# Patient Record
Sex: Male | Born: 1995 | Race: Black or African American | Hispanic: No | Marital: Single | Smoking: Current every day smoker
Health system: Southern US, Community
[De-identification: ages and names within clinical notes are randomized; demographics above are authoritative.]

## PROBLEM LIST (undated history)

## (undated) DIAGNOSIS — J302 Other seasonal allergic rhinitis: Secondary | ICD-10-CM

## (undated) DIAGNOSIS — F909 Attention-deficit hyperactivity disorder, unspecified type: Secondary | ICD-10-CM

## (undated) HISTORY — DX: Attention-deficit hyperactivity disorder, unspecified type: F90.9

---
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
## 2020-04-27 ENCOUNTER — Ambulatory Visit
Admission: EM | Admit: 2020-04-27 | Discharge: 2020-04-27 | Disposition: A | Discharge status: Home / Self Care | Payer: Self-pay | Attending: Emergency Medicine | Admitting: Emergency Medicine

## 2020-04-27 ENCOUNTER — Other Ambulatory Visit: Payer: Self-pay

## 2020-04-27 DIAGNOSIS — Z202 Contact with and (suspected) exposure to infections with a predominantly sexual mode of transmission: Secondary | ICD-10-CM | POA: Insufficient documentation

## 2020-04-27 DIAGNOSIS — Z113 Encounter for screening for infections with a predominantly sexual mode of transmission: Secondary | ICD-10-CM | POA: Insufficient documentation

## 2020-04-27 HISTORY — DX: Other seasonal allergic rhinitis: J30.2

## 2020-04-27 MED ORDER — DOXYCYCLINE HYCLATE 100 MG PO CAPS: 100.0000 mg | ORAL_CAPSULE | Freq: Two times a day (BID) | ORAL | 0 refills | Status: AC

## 2020-04-27 NOTE — Discharge Instructions (Signed)
Please pick up doxycycline from the pharmacy and take twice daily for 1 week to treat chlamydia.  Please refrain from sexual activity for 7 days while medicine is clearing infection.  We are testing you for Gonorrhea, Chlamydia and Trichomonas. We will call you if anything is positive and let you know if you require any further treatment. Please inform partner of any positive results.  Please return if symptoms not improving with treatment, development of fever, nausea, vomiting, abdominal pain, scrotal pain.

## 2020-04-27 NOTE — ED Provider Notes (Signed)
[facility]    CSN: [id number] Arrival date & time: 04/27/20  1249      History   Chief Complaint No chief complaint on file. STD exposure  HPI Edward Howard is a 25 y.o. male presenting today for STD screening.  Reports recent exposure to chlamydia.  Denies any current symptoms.  Had recent intercourse with this person 1 week ago.  HPI  Past Medical History:  Diagnosis Date  . ADHD (attention deficit hyperactivity disorder)   . Seasonal allergies     There are no problems to display for this patient.   History reviewed. No pertinent surgical history.     Home Medications    Prior to Admission medications   Medication Sig Start Date End Date Taking? Authorizing Provider  doxycycline (VIBRAMYCIN) 100 MG capsule Take 1 capsule (100 mg total) by mouth 2 (two) times daily for 7 days. 04/27/20 05/04/20 Yes Joshau Code, Junius Creamer, PA-C    Family History Family History  Problem Relation Age of Onset  . Healthy Mother   . Healthy Father     Social History Social History   Tobacco Use  . Smoking status: Current Every Day Smoker    Packs/day: 0.50    Types: Cigarettes  . Smokeless tobacco: Never Used  Substance Use Topics  . Alcohol use: No  . Drug use: No     Allergies   Patient has no known allergies.   Review of Systems Review of Systems  Constitutional: Negative for fever.  HENT: Negative for sore throat.   Respiratory: Negative for shortness of breath.   Cardiovascular: Negative for chest pain.  Gastrointestinal: Negative for abdominal pain, nausea and vomiting.  Genitourinary: Negative for difficulty urinating, dysuria, frequency, penile discharge, penile pain, penile swelling, scrotal swelling and testicular pain.  Skin: Negative for rash.  Neurological: Negative for dizziness, light-headedness and headaches.     Physical Exam Triage Vital Signs ED Triage Vitals  Enc Vitals Group     BP 04/27/20 1308 116/69     Pulse Rate 04/27/20  1308 67     Resp 04/27/20 1308 20     Temp 04/27/20 1308 98.4 F (36.9 C)     Temp src --      SpO2 04/27/20 1308 99 %     Weight --      Height --      Head Circumference --      Peak Flow --      Pain Score 04/27/20 1306 0     Pain Loc --      Pain Edu? --      Excl. in GC? --    No data found.  Updated Vital Signs BP 116/69   Pulse 67   Temp 98.4 F (36.9 C)   Resp 20   SpO2 99%   Visual Acuity Right Eye Distance:   Left Eye Distance:   Bilateral Distance:    Right Eye Near:   Left Eye Near:    Bilateral Near:     Physical Exam Vitals and nursing note reviewed.  Constitutional:      Appearance: He is well-developed.     Comments: No acute distress  HENT:     Head: Normocephalic and atraumatic.     Nose: Nose normal.  Eyes:     Conjunctiva/sclera: Conjunctivae normal.  Cardiovascular:     Rate and Rhythm: Normal rate.  Pulmonary:     Effort: Pulmonary effort is normal. No respiratory distress.  Abdominal:  General: There is no distension.  Musculoskeletal:        General: Normal range of motion.     Cervical back: Neck supple.  Skin:    General: Skin is warm and dry.  Neurological:     Mental Status: He is alert and oriented to person, place, and time.      UC Treatments / Results  Labs (all labs ordered are listed, but only abnormal results are displayed) Labs Reviewed  CYTOLOGY, (ORAL, ANAL, URETHRAL) ANCILLARY ONLY    EKG   Radiology No results found.  Procedures Procedures (including critical care time)  Medications Ordered in UC Medications - No data to display  Initial Impression / Assessment and Plan / UC Course  I have reviewed the triage vital signs and the nursing notes.  Pertinent labs & imaging results that were available during my care of the patient were reviewed by me and considered in my medical decision making (see chart for details).     STD screening/exposure to chlamydia-treating with doxycycline x1 week  to cover chlamydia, urethral swab pending to further screen for STDs.  Discussed strict return precautions. Patient verbalized understanding and is agreeable with plan.  Final Clinical Impressions(s) / UC Diagnoses   Final diagnoses:  Screen for STD (sexually transmitted disease)  Exposure to chlamydia     Discharge Instructions     Please pick up doxycycline from the pharmacy and take twice daily for 1 week to treat chlamydia.  Please refrain from sexual activity for 7 days while medicine is clearing infection.  We are testing you for Gonorrhea, Chlamydia and Trichomonas. We will call you if anything is positive and let you know if you require any further treatment. Please inform partner of any positive results.  Please return if symptoms not improving with treatment, development of fever, nausea, vomiting, abdominal pain, scrotal pain.    ED Prescriptions    Medication Sig Dispense Auth. Provider   doxycycline (VIBRAMYCIN) 100 MG capsule Take 1 capsule (100 mg total) by mouth 2 (two) times daily for 7 days. 14 capsule Ginette Bradway, [place] C, PA-C     PDMP not reviewed this encounter.   Lew Dawes, [place] 04/27/20 1347

## 2020-04-27 NOTE — ED Triage Notes (Signed)
Pt was with someone who tested positive for Chlamydia. Denies any symptoms.

## 2020-04-28 LAB — CYTOLOGY, (ORAL, ANAL, URETHRAL) ANCILLARY ONLY
Chlamydia: NEGATIVE
Comment: NEGATIVE
Comment: NEGATIVE
Comment: NORMAL
Neisseria Gonorrhea: NEGATIVE
Trichomonas: NEGATIVE

## 2020-10-20 ENCOUNTER — Encounter: Payer: Self-pay | Admitting: Emergency Medicine

## 2020-10-20 ENCOUNTER — Emergency Department
Admission: EM | Admit: 2020-10-20 | Discharge: 2020-10-20 | Disposition: A | Discharge status: Home / Self Care | Payer: Self-pay | Attending: Emergency Medicine | Admitting: Emergency Medicine

## 2020-10-20 ENCOUNTER — Other Ambulatory Visit: Payer: Self-pay

## 2020-10-20 DIAGNOSIS — J0100 Acute maxillary sinusitis, unspecified: Secondary | ICD-10-CM | POA: Insufficient documentation

## 2020-10-20 DIAGNOSIS — Z20822 Contact with and (suspected) exposure to covid-19: Secondary | ICD-10-CM | POA: Insufficient documentation

## 2020-10-20 DIAGNOSIS — F17210 Nicotine dependence, cigarettes, uncomplicated: Secondary | ICD-10-CM | POA: Insufficient documentation

## 2020-10-20 LAB — RESP PANEL BY RT-PCR (FLU A&B, COVID) ARPGX2
Influenza A by PCR: NEGATIVE
Influenza B by PCR: NEGATIVE
SARS Coronavirus 2 by RT PCR: NEGATIVE

## 2020-10-20 MED ORDER — ACETAMINOPHEN 325 MG PO TABS
650.0000 mg | ORAL_TABLET | Freq: Once | ORAL | Status: AC
  Administered 2020-10-20: 650 mg via ORAL
  Filled 2020-10-20: qty 2

## 2020-10-20 MED ORDER — AMOXICILLIN-POT CLAVULANATE 875-125 MG PO TABS: 1.0000 | ORAL_TABLET | Freq: Two times a day (BID) | ORAL | 0 refills | Status: DC

## 2020-10-20 MED ORDER — FLUTICASONE PROPIONATE 50 MCG/ACT NA SUSP: 1.0000 | Freq: Every day | NASAL | 0 refills | Status: AC

## 2020-10-20 MED ORDER — IBUPROFEN 800 MG PO TABS
800.0000 mg | ORAL_TABLET | Freq: Once | ORAL | Status: AC
  Administered 2020-10-20: 800 mg via ORAL
  Filled 2020-10-20: qty 1

## 2020-10-20 MED ORDER — DEXAMETHASONE 6 MG PO TABS
10.0000 mg | ORAL_TABLET | Freq: Once | ORAL | Status: AC
  Administered 2020-10-20: 10 mg via ORAL
  Filled 2020-10-20: qty 1

## 2020-10-20 MED ORDER — AMOXICILLIN-POT CLAVULANATE 875-125 MG PO TABS: 1.0000 | ORAL_TABLET | Freq: Two times a day (BID) | ORAL | 0 refills | Status: AC

## 2020-10-20 MED ORDER — FLUTICASONE PROPIONATE 50 MCG/ACT NA SUSP: 1.0000 | Freq: Every day | NASAL | 0 refills | Status: DC

## 2020-10-20 MED ORDER — AMOXICILLIN-POT CLAVULANATE 875-125 MG PO TABS
1.0000 | ORAL_TABLET | Freq: Once | ORAL | Status: AC
  Administered 2020-10-20: 1 via ORAL
  Filled 2020-10-20: qty 1

## 2020-10-20 NOTE — Discharge Instructions (Signed)
You may alternate Tylenol 1000 mg every 6 hours as needed for pain, fever and Ibuprofen 800 mg every 8 hours as needed for pain, fever.  Please take Ibuprofen with food.  Do not take more than 4000 mg of Tylenol (acetaminophen) in a 24 hour period.  

## 2020-10-20 NOTE — ED Triage Notes (Signed)
Pt reports facial/pain and pressure with congestion x wk

## 2020-10-20 NOTE — ED Provider Notes (Signed)
[facility] Emergency Department Provider Note  ____________________________________________   Event Date/Time   First MD Initiated Contact with Patient 10/20/20 [phone]     (approximate)  I have reviewed the triage vital signs and the nursing notes.   HISTORY  Chief Complaint Facial Pain    HPI Edward Howard is a 25 y.o. male with history of seasonal allergies, ADHD who presents to the emergency department with 1 week of fever, left-sided sinus pressure, phantosmia, dental pain.  States that he feels like he is smelling onions or something that smelled like it has died.  No cough, chest pain, shortness of breath, sore throat, ear pain, nausea or vomiting.  Has had some diarrhea.  No abdominal pain.  No sick contacts or recent travel.        Past Medical History:  Diagnosis Date   ADHD (attention deficit hyperactivity disorder)    Seasonal allergies     There are no problems to display for this patient.   History reviewed. No pertinent surgical history.  Prior to Admission medications   Medication Sig Start Date End Date Taking? Authorizing Provider  amoxicillin-clavulanate (AUGMENTIN) 875-125 MG tablet Take 1 tablet by mouth 2 (two) times daily for 7 days. 10/20/20 10/27/20  Anagabriela Jokerst, Layla Maw, DO  fluticasone (FLONASE) 50 MCG/ACT nasal spray Place 1 spray into both nostrils daily. 10/20/20 10/20/21  Mance Vallejo, Layla Maw, DO    Allergies Patient has no known allergies.  Family History  Problem Relation Age of Onset   Healthy Mother    Healthy Father     Social History Social History   Tobacco Use   Smoking status: Every Day    Packs/day: 0.50    Types: Cigarettes   Smokeless tobacco: Never  Vaping Use   Vaping Use: Never used  Substance Use Topics   Alcohol use: No   Drug use: No    Review of Systems Constitutional: No fever. Eyes: No visual changes. ENT: No sore throat. Cardiovascular: Denies chest pain. Respiratory: Denies  shortness of breath. Gastrointestinal: No nausea, vomiting.  + Diarrhea. Genitourinary: Negative for dysuria. Musculoskeletal: Negative for back pain. Skin: Negative for rash. Neurological: Negative for focal weakness or numbness.  ____________________________________________   PHYSICAL EXAM:  VITAL SIGNS: ED Triage Vitals  Enc Vitals Group     BP 10/20/20 0041 121/78     Pulse Rate 10/20/20 0041 98     Resp 10/20/20 0041 16     Temp 10/20/20 0041 (!) 102 F (38.9 C)     Temp Source 10/20/20 0041 Oral     SpO2 10/20/20 0041 98 %     Weight 10/20/20 0041 150 lb (68 kg)     Height 10/20/20 0041 6' (1.829 m)     Head Circumference --      Peak Flow --      Pain Score 10/20/20 0041 8     Pain Loc --      Pain Edu? --      Excl. in GC? --    CONSTITUTIONAL: Alert and oriented and responds appropriately to questions. Well-appearing; well-nourished, febrile but nontoxic HEAD: Normocephalic EYES: Conjunctivae clear, pupils appear equal, EOM appear intact ENT: normal nose; moist mucous membranes; No pharyngeal erythema or petechiae, no tonsillar hypertrophy or exudate, no uvular deviation, no unilateral swelling, no trismus or drooling, no muffled voice, normal phonation, no stridor, + dental caries present, no drainable dental abscess noted, no Ludwig's angina, tongue sits flat in the bottom of the  mouth, no angioedema, no facial erythema or warmth, no facial swelling; no pain with movement of the neck, no cervical LAD.  Patient has tenderness to palpation over the left maxillary sinus without redness, warmth, soft tissue swelling. NECK: Supple, normal ROM, no meningismus CARD: RRR; S1 and S2 appreciated; no murmurs, no clicks, no rubs, no gallops RESP: Normal chest excursion without splinting or tachypnea; breath sounds clear and equal bilaterally; no wheezes, no rhonchi, no rales, no hypoxia or respiratory distress, speaking full sentences ABD/GI: Normal bowel sounds; non-distended;  soft, non-tender, no rebound, no guarding, no peritoneal signs, no hepatosplenomegaly BACK: The back appears normal EXT: Normal ROM in all joints; no deformity noted, no edema; no cyanosis SKIN: Normal color for age and race; warm; no rash on exposed skin NEURO: Moves all extremities equally PSYCH: The patient's mood and manner are appropriate.  ____________________________________________   LABS (all labs ordered are listed, but only abnormal results are displayed)  Labs Reviewed  RESP PANEL BY RT-PCR (FLU A&B, COVID) [id number]   ____________________________________________  EKG   ____________________________________________  RADIOLOGY I, Margueritte Guthridge, personally viewed and evaluated these images (plain radiographs) as part of my medical decision making, as well as reviewing the written report by the radiologist.  ED MD interpretation:    Official radiology report(s): No results found.  ____________________________________________   PROCEDURES  Procedure(s) performed (including Critical Care):  Procedures    ____________________________________________   INITIAL IMPRESSION / ASSESSMENT AND PLAN / ED COURSE  As part of my medical decision making, I reviewed the following data within the electronic MEDICAL RECORD NUMBER Nursing notes reviewed and incorporated, Labs reviewed , Old chart reviewed, and Notes from prior ED visits         Patient here with left-sided sinus pressure, dental pain, fever.  Does have dental caries on exam but no sign of drainable dental abscess, Ludwick's angina.  No sign of pharyngitis, tonsillitis, deep space neck infection, uvulitis, PTA.  Doubt meningitis.  Given symptoms have been ongoing for a week with associated fevers, will cover with antibiotics for possible bacterial sinusitis as well as possible dental abscess.  He is otherwise well-appearing here today.  We will give ibuprofen, Decadron for symptomatic relief as well and discharged with  Flonase.  Patient provided with outpatient follow-up.  Discussed return precautions.  At this time, I do not feel there is any life-threatening condition present. I have reviewed, interpreted and discussed all results (EKG, imaging, lab, urine as appropriate) and exam findings with patient/family. I have reviewed nursing notes and appropriate previous records.  I feel the patient is safe to be discharged home without further emergent workup and can continue workup as an outpatient as needed. Discussed usual and customary return precautions. Patient/family verbalize understanding and are comfortable with this plan.  Outpatient follow-up has been provided as needed. All questions have been answered.  ____________________________________________   FINAL CLINICAL IMPRESSION(S) / ED DIAGNOSES  Final diagnoses:  Acute non-recurrent maxillary sinusitis     ED Discharge Orders          Ordered    amoxicillin-clavulanate (AUGMENTIN) 875-125 MG tablet  2 times daily,   Status:  Discontinued        10/20/20 0251    fluticasone (FLONASE) 50 MCG/ACT nasal spray  Daily,   Status:  Discontinued        10/20/20 0251    amoxicillin-clavulanate (AUGMENTIN) 875-125 MG tablet  2 times daily        10/20/20 0252  fluticasone (FLONASE) 50 MCG/ACT nasal spray  Daily        10/20/20 0252            *Please note:  Charls Custer was evaluated in Emergency Department on 10/20/2020 for the symptoms described in the history of present illness. He was evaluated in the context of the global COVID-19 pandemic, which necessitated consideration that the patient might be at risk for infection with the SARS-CoV-2 virus that causes COVID-19. Institutional protocols and algorithms that pertain to the evaluation of patients at risk for COVID-19 are in a state of rapid change based on information released by regulatory bodies including the CDC and federal and state organizations. These policies and algorithms were followed  during the patient's care in the ED.  Some ED evaluations and interventions may be delayed as a result of limited staffing during and the pandemic.*   Note:  This document was prepared using Dragon voice recognition software and may include unintentional dictation errors.    Stephanne Greeley, Layla Maw, DO 10/20/20 [phone]

## 2022-01-25 ENCOUNTER — Encounter (HOSPITAL_COMMUNITY): Payer: Self-pay

## 2022-01-25 ENCOUNTER — Emergency Department (HOSPITAL_COMMUNITY)
Admission: EM | Admit: 2022-01-25 | Discharge: 2022-01-25 | Disposition: A | Discharge status: Home / Self Care | Payer: Medicaid Other | Attending: Emergency Medicine | Admitting: Emergency Medicine

## 2022-01-25 ENCOUNTER — Other Ambulatory Visit: Payer: Self-pay

## 2022-01-25 DIAGNOSIS — Z711 Person with feared health complaint in whom no diagnosis is made: Secondary | ICD-10-CM

## 2022-01-25 DIAGNOSIS — L739 Follicular disorder, unspecified: Secondary | ICD-10-CM | POA: Insufficient documentation

## 2022-01-25 DIAGNOSIS — Z202 Contact with and (suspected) exposure to infections with a predominantly sexual mode of transmission: Secondary | ICD-10-CM | POA: Insufficient documentation

## 2022-01-25 LAB — URINALYSIS, ROUTINE W REFLEX MICROSCOPIC
Bacteria, UA: NONE SEEN
Bilirubin Urine: NEGATIVE
Glucose, UA: NEGATIVE mg/dL
Hgb urine dipstick: NEGATIVE
Ketones, ur: NEGATIVE mg/dL
Nitrite: NEGATIVE
Protein, ur: 30 mg/dL — AB
Specific Gravity, Urine: 1.026 (ref 1.005–1.030)
WBC, UA: >50 WBC/hpf — ABNORMAL HIGH (ref 0–5)
pH: 5 (ref 5.0–8.0)

## 2022-01-25 LAB — GC/CHLAMYDIA PROBE AMP (~~LOC~~) NOT AT ARMC
Chlamydia: POSITIVE — AB
Comment: NEGATIVE
Comment: NORMAL
Neisseria Gonorrhea: NEGATIVE

## 2022-01-25 LAB — RPR: RPR Ser Ql: NONREACTIVE

## 2022-01-25 LAB — HIV ANTIBODY (ROUTINE TESTING W REFLEX): HIV Screen 4th Generation wRfx: NONREACTIVE

## 2022-01-25 MED ORDER — STERILE WATER FOR INJECTION IJ SOLN
INTRAMUSCULAR | Status: AC
  Filled 2022-01-25: qty 10

## 2022-01-25 MED ORDER — DOXYCYCLINE HYCLATE 100 MG PO TABS
100.0000 mg | ORAL_TABLET | Freq: Once | ORAL | Status: AC
  Administered 2022-01-25: 100 mg via ORAL
  Filled 2022-01-25: qty 1

## 2022-01-25 MED ORDER — DOXYCYCLINE HYCLATE 100 MG PO CAPS: 100.0000 mg | ORAL_CAPSULE | Freq: Two times a day (BID) | ORAL | 0 refills | Status: AC

## 2022-01-25 MED ORDER — CEFTRIAXONE SODIUM 1 G IJ SOLR
500.0000 mg | Freq: Once | INTRAMUSCULAR | Status: AC
  Administered 2022-01-25: 500 mg via INTRAMUSCULAR
  Filled 2022-01-25: qty 10

## 2022-01-25 NOTE — ED Triage Notes (Signed)
Pt states that he has red bumps in his private area. Pt reports having multiple sexual partners unprotected. Pt would like an STD check.

## 2022-01-25 NOTE — ED Provider Notes (Signed)
  [facility] DEPT Provider Note   CSN: [id number] Arrival date & time: 01/25/22  0025     History  Chief Complaint  Patient presents with   Exposure to STD    Edward Howard is a 27 y.o. male.  Patient presents with complaints of bumps on his penis.  Patient reports that symptoms have been present for several days.  The individual areas are tender.  No drainage.  No urethral discharge.  No testicular pain or swelling.       Home Medications Prior to Admission medications   Medication Sig Start Date End Date Taking? Authorizing Provider  doxycycline (VIBRAMYCIN) 100 MG capsule Take 1 capsule (100 mg total) by mouth 2 (two) times daily. 01/25/22  Yes Jaise Moser, Gwenyth Allegra, MD  fluticasone (FLONASE) 50 MCG/ACT nasal spray Place 1 spray into both nostrils daily. 10/20/20 10/20/21  Ward, Delice Bison, DO      Allergies    Patient has no known allergies.    Review of Systems   Review of Systems  Physical Exam Updated Vital Signs BP 124/72 (BP Location: Right Arm)   Pulse 91   Temp 100.1 F (37.8 C) (Oral)   Resp 18   Ht 6' (1.829 m)   Wt 61.2 kg   SpO2 100%   BMI 18.31 kg/m  Physical Exam Vitals and nursing note reviewed.  Constitutional:      Appearance: Normal appearance.  HENT:     Head: Normocephalic.  Genitourinary:    Penis: Lesions (Tiny 1 to 2 mm slightly ulcerated areas associated with hair follicles) present. No tenderness, discharge or swelling.      Testes: Normal. Cremasteric reflex is present.  Skin:    Findings: Rash (Penis, as above) present.  Neurological:     Mental Status: He is alert.     ED Results / Procedures / Treatments   Labs (all labs ordered are listed, but only abnormal results are displayed) Labs Reviewed  HSV CULTURE AND TYPING  URINALYSIS, ROUTINE W REFLEX MICROSCOPIC  HIV ANTIBODY (ROUTINE TESTING W REFLEX)  GC/CHLAMYDIA PROBE AMP ([place]) NOT AT [facility]    EKG None  Radiology No results  found.  Procedures Procedures    Medications Ordered in ED Medications  cefTRIAXone (ROCEPHIN) injection 500 mg (has no administration in time range)  doxycycline (VIBRA-TABS) tablet 100 mg (has no administration in time range)    ED Course/ Medical Decision Making/ A&P                             Medical Decision Making Amount and/or Complexity of Data Reviewed Labs: ordered.   With multiple sex partners, does not use a condom.  He has a rash at the base of his penis.  Areas look more like a folliculitis, cannot rule out HSV.  HSV testing sent.  HIV testing, GC chlamydia testing sent.  Will treat Rocephin, doxycycline.        Final Clinical Impression(s) / ED Diagnoses Final diagnoses:  Concern about STD in male without diagnosis  Folliculitis    Rx / DC Orders ED Discharge Orders          Ordered    doxycycline (VIBRAMYCIN) 100 MG capsule  2 times daily        01/25/22 0115              Orpah Greek, MD 01/25/22 [phone]

## 2022-01-27 LAB — HSV CULTURE AND TYPING

## 2022-01-31 ENCOUNTER — Other Ambulatory Visit: Payer: Self-pay

## 2022-01-31 ENCOUNTER — Emergency Department (HOSPITAL_COMMUNITY)
Admission: EM | Admit: 2022-01-31 | Discharge: 2022-01-31 | Disposition: A | Discharge status: Home / Self Care | Payer: Self-pay | Attending: Emergency Medicine | Admitting: Emergency Medicine

## 2022-01-31 DIAGNOSIS — A749 Chlamydial infection, unspecified: Secondary | ICD-10-CM | POA: Insufficient documentation

## 2022-01-31 DIAGNOSIS — B009 Herpesviral infection, unspecified: Secondary | ICD-10-CM | POA: Insufficient documentation

## 2022-01-31 DIAGNOSIS — R001 Bradycardia, unspecified: Secondary | ICD-10-CM | POA: Insufficient documentation

## 2022-01-31 MED ORDER — ACYCLOVIR 400 MG PO TABS: 400.0000 mg | ORAL_TABLET | Freq: Three times a day (TID) | ORAL | 0 refills | Status: AC

## 2022-01-31 NOTE — Discharge Instructions (Addendum)
Please continue to take doxycycline for the entire 10-day course and do not miss any doses.  I have sent over a prescription for acyclovir to start taking today for the next 10 days.  You will take this 3 times per day.   I recommend following up with your primary care provider should you continue to have symptoms.  Please practice safe sex practices including using a condom.  Do not have sex until your rash has resolved and you have completed the entire course of antiviral and antibiotic.

## 2022-01-31 NOTE — ED Triage Notes (Signed)
Pt states his  test results came back positive for Chlamydia

## 2022-01-31 NOTE — ED Provider Notes (Addendum)
[facility] Provider Note   CSN: [id number] Arrival date & time: 01/31/22  1651     History  Chief Complaint  Patient presents with   SEXUALLY TRANSMITTED DISEASE    Edward Howard is a 27 y.o. male presents to the ED stating that his test results for chlamydia and HSV2 came back positive.  Patient was seen on 01/25/2022 and was treated prophylactically with IM Rocephin and was placed on a 10-day prescription of doxycycline.  Patient states he has been taking his antibiotic as prescribed.  He has not had any development of any new symptoms.  He has been putting Vaseline on the area for itching.       Home Medications Prior to Admission medications   Medication Sig Start Date End Date Taking? Authorizing Provider  acyclovir (ZOVIRAX) 400 MG tablet Take 1 tablet (400 mg total) by mouth 3 (three) times daily for 10 days. 01/31/22 02/10/22 Yes Donoven Pett R, PA  doxycycline (VIBRAMYCIN) 100 MG capsule Take 1 capsule (100 mg total) by mouth 2 (two) times daily. 01/25/22   Orpah Greek, MD  fluticasone (FLONASE) 50 MCG/ACT nasal spray Place 1 spray into both nostrils daily. 10/20/20 10/20/21  Ward, Delice Bison, DO      Allergies    Patient has no known allergies.    Review of Systems   Review of Systems  Genitourinary:  Negative for dysuria, genital sores, penile discharge, penile pain, penile swelling, scrotal swelling and testicular pain.    Physical Exam Updated Vital Signs BP (!) 107/57 (BP Location: Left Arm)   Pulse (!) 56   Temp 98.2 F (36.8 C) (Oral)   Resp 16   Ht 6' (1.829 m)   Wt 61 kg   SpO2 98%   BMI 18.24 kg/m  Physical Exam Vitals and nursing note reviewed.  Constitutional:      General: He is not in acute distress.    Appearance: Normal appearance. He is not ill-appearing or diaphoretic.  Cardiovascular:     Rate and Rhythm: Regular rhythm. Bradycardia present.  Pulmonary:     Effort: Pulmonary effort  is normal.  Genitourinary:    Comments: Patient deferred exam, states that he does still have a few red bumps left in the area mostly unchanged from prior ED visit Skin:    General: Skin is warm and dry.     Capillary Refill: Capillary refill takes less than 2 seconds.  Neurological:     Mental Status: He is alert. Mental status is at baseline.  Psychiatric:        Mood and Affect: Mood normal.        Behavior: Behavior normal.     ED Results / Procedures / Treatments   Labs (all labs ordered are listed, but only abnormal results are displayed) Labs Reviewed - No data to display  EKG None  Radiology No results found.  Procedures Procedures    Medications Ordered in ED Medications - No data to display  ED Course/ Medical Decision Making/ A&P                             Medical Decision Making  Patient presents to the ED with concerns of having a positive chlamydia test.  Patient was seen on 01/25/2022 for concerns of STI/STD.  Patient was treated with IM Rocephin and placed on a 10-day prescription of doxycycline.  Patient has been taking  antibiotic as prescribed.  He does report having multiple sexual partners and not using a condom.  Patient has been using Vaseline for skin irritation.  He denies any development of new symptoms today.  He states that he only came back due to having a positive test.  Discussed with patient that so long as he is taking the antibiotic as prescribed, he is currently being treated for chlamydia.  Patient states he has been taking medication as prescribed and that he has no other concerns.  Advised patient to continue taking antibiotics for the entirety of course and to continue monitoring for change of symptoms or improvement in symptoms.  Will add treatment for HSV-2 in addition to doxycycline.  Patient verbally states his understanding and is in agreement with plan of discharge.  The patient has been appropriately medically screened and/or  stabilized in the ED. I have low suspicion for any other emergent medical condition which would require further screening, evaluation or treatment in the ED or require inpatient management. At time of discharge the patient is hemodynamically stable and in no acute distress. I have discussed work-up results and diagnosis with patient and answered all questions. Patient is agreeable with discharge plan. We discussed strict return precautions for returning to the emergency department and they verbalized understanding.           Final Clinical Impression(s) / ED Diagnoses Final diagnoses:  Chlamydia  HSV-2 infection    Rx / DC Orders ED Discharge Orders          Ordered    acyclovir (ZOVIRAX) 400 MG tablet  3 times daily        01/31/22 1725              Pat Kocher, [place] 01/31/22 1713    Pat Kocher, [place] 01/31/22 1726    Drenda Freeze, MD 01/31/22 2024
